# Patient Record
Sex: Male | Born: 1940 | Race: White | Hispanic: No | Smoking: Former smoker
Health system: Southern US, Community
[De-identification: ages and names within clinical notes are randomized; demographics above are authoritative.]

## PROBLEM LIST (undated history)

## (undated) DIAGNOSIS — I10 Essential (primary) hypertension: Secondary | ICD-10-CM

## (undated) DIAGNOSIS — E119 Type 2 diabetes mellitus without complications: Secondary | ICD-10-CM

---

## 2011-11-20 DIAGNOSIS — Z85828 Personal history of other malignant neoplasm of skin: Secondary | ICD-10-CM | POA: Diagnosis not present

## 2011-11-20 DIAGNOSIS — D485 Neoplasm of uncertain behavior of skin: Secondary | ICD-10-CM | POA: Diagnosis not present

## 2011-11-20 DIAGNOSIS — L570 Actinic keratosis: Secondary | ICD-10-CM | POA: Diagnosis not present

## 2011-11-20 DIAGNOSIS — D239 Other benign neoplasm of skin, unspecified: Secondary | ICD-10-CM | POA: Diagnosis not present

## 2012-03-11 DIAGNOSIS — Z125 Encounter for screening for malignant neoplasm of prostate: Secondary | ICD-10-CM | POA: Diagnosis not present

## 2012-03-11 DIAGNOSIS — Z79899 Other long term (current) drug therapy: Secondary | ICD-10-CM | POA: Diagnosis not present

## 2012-03-11 DIAGNOSIS — E119 Type 2 diabetes mellitus without complications: Secondary | ICD-10-CM | POA: Diagnosis not present

## 2012-03-13 DIAGNOSIS — E119 Type 2 diabetes mellitus without complications: Secondary | ICD-10-CM | POA: Diagnosis not present

## 2012-03-13 DIAGNOSIS — Z23 Encounter for immunization: Secondary | ICD-10-CM | POA: Diagnosis not present

## 2012-03-13 DIAGNOSIS — I10 Essential (primary) hypertension: Secondary | ICD-10-CM | POA: Diagnosis not present

## 2012-03-13 DIAGNOSIS — E7800 Pure hypercholesterolemia, unspecified: Secondary | ICD-10-CM | POA: Diagnosis not present

## 2012-06-04 DIAGNOSIS — E119 Type 2 diabetes mellitus without complications: Secondary | ICD-10-CM | POA: Diagnosis not present

## 2012-11-25 DIAGNOSIS — L723 Sebaceous cyst: Secondary | ICD-10-CM | POA: Diagnosis not present

## 2012-11-25 DIAGNOSIS — Z85828 Personal history of other malignant neoplasm of skin: Secondary | ICD-10-CM | POA: Diagnosis not present

## 2012-11-25 DIAGNOSIS — L821 Other seborrheic keratosis: Secondary | ICD-10-CM | POA: Diagnosis not present

## 2012-12-11 DIAGNOSIS — E119 Type 2 diabetes mellitus without complications: Secondary | ICD-10-CM | POA: Diagnosis not present

## 2012-12-11 DIAGNOSIS — E782 Mixed hyperlipidemia: Secondary | ICD-10-CM | POA: Diagnosis not present

## 2012-12-16 DIAGNOSIS — E119 Type 2 diabetes mellitus without complications: Secondary | ICD-10-CM | POA: Diagnosis not present

## 2012-12-16 DIAGNOSIS — E7800 Pure hypercholesterolemia, unspecified: Secondary | ICD-10-CM | POA: Diagnosis not present

## 2012-12-16 DIAGNOSIS — I10 Essential (primary) hypertension: Secondary | ICD-10-CM | POA: Diagnosis not present

## 2013-04-01 DIAGNOSIS — I10 Essential (primary) hypertension: Secondary | ICD-10-CM | POA: Diagnosis not present

## 2013-04-01 DIAGNOSIS — Z125 Encounter for screening for malignant neoplasm of prostate: Secondary | ICD-10-CM | POA: Diagnosis not present

## 2013-04-01 DIAGNOSIS — E119 Type 2 diabetes mellitus without complications: Secondary | ICD-10-CM | POA: Diagnosis not present

## 2013-04-02 DIAGNOSIS — Z006 Encounter for examination for normal comparison and control in clinical research program: Secondary | ICD-10-CM | POA: Diagnosis not present

## 2013-04-02 DIAGNOSIS — E119 Type 2 diabetes mellitus without complications: Secondary | ICD-10-CM | POA: Diagnosis not present

## 2013-06-22 DIAGNOSIS — E119 Type 2 diabetes mellitus without complications: Secondary | ICD-10-CM | POA: Diagnosis not present

## 2013-06-22 DIAGNOSIS — Z0000 Encounter for general adult medical examination without abnormal findings: Secondary | ICD-10-CM | POA: Diagnosis not present

## 2013-07-06 DIAGNOSIS — E119 Type 2 diabetes mellitus without complications: Secondary | ICD-10-CM | POA: Diagnosis not present

## 2013-11-25 DIAGNOSIS — D1801 Hemangioma of skin and subcutaneous tissue: Secondary | ICD-10-CM | POA: Diagnosis not present

## 2013-11-25 DIAGNOSIS — L821 Other seborrheic keratosis: Secondary | ICD-10-CM | POA: Diagnosis not present

## 2013-11-25 DIAGNOSIS — Z85820 Personal history of malignant melanoma of skin: Secondary | ICD-10-CM | POA: Diagnosis not present

## 2013-11-25 DIAGNOSIS — L570 Actinic keratosis: Secondary | ICD-10-CM | POA: Diagnosis not present

## 2014-01-25 DIAGNOSIS — E119 Type 2 diabetes mellitus without complications: Secondary | ICD-10-CM | POA: Diagnosis not present

## 2014-01-27 DIAGNOSIS — E119 Type 2 diabetes mellitus without complications: Secondary | ICD-10-CM | POA: Diagnosis not present

## 2014-08-19 DIAGNOSIS — E119 Type 2 diabetes mellitus without complications: Secondary | ICD-10-CM | POA: Diagnosis not present

## 2014-08-24 DIAGNOSIS — Z0000 Encounter for general adult medical examination without abnormal findings: Secondary | ICD-10-CM | POA: Diagnosis not present

## 2014-08-24 DIAGNOSIS — E119 Type 2 diabetes mellitus without complications: Secondary | ICD-10-CM | POA: Diagnosis not present

## 2014-11-15 DIAGNOSIS — D1801 Hemangioma of skin and subcutaneous tissue: Secondary | ICD-10-CM | POA: Diagnosis not present

## 2014-11-15 DIAGNOSIS — L858 Other specified epidermal thickening: Secondary | ICD-10-CM | POA: Diagnosis not present

## 2014-11-15 DIAGNOSIS — L918 Other hypertrophic disorders of the skin: Secondary | ICD-10-CM | POA: Diagnosis not present

## 2014-11-15 DIAGNOSIS — Z85820 Personal history of malignant melanoma of skin: Secondary | ICD-10-CM | POA: Diagnosis not present

## 2014-11-15 DIAGNOSIS — L821 Other seborrheic keratosis: Secondary | ICD-10-CM | POA: Diagnosis not present

## 2015-02-01 DIAGNOSIS — H40053 Ocular hypertension, bilateral: Secondary | ICD-10-CM | POA: Diagnosis not present

## 2015-02-01 DIAGNOSIS — H35363 Drusen (degenerative) of macula, bilateral: Secondary | ICD-10-CM | POA: Diagnosis not present

## 2015-02-01 DIAGNOSIS — H43393 Other vitreous opacities, bilateral: Secondary | ICD-10-CM | POA: Diagnosis not present

## 2015-02-01 DIAGNOSIS — H52223 Regular astigmatism, bilateral: Secondary | ICD-10-CM | POA: Diagnosis not present

## 2015-02-01 DIAGNOSIS — H2513 Age-related nuclear cataract, bilateral: Secondary | ICD-10-CM | POA: Diagnosis not present

## 2015-02-01 DIAGNOSIS — H5213 Myopia, bilateral: Secondary | ICD-10-CM | POA: Diagnosis not present

## 2015-02-01 DIAGNOSIS — E119 Type 2 diabetes mellitus without complications: Secondary | ICD-10-CM | POA: Diagnosis not present

## 2015-03-01 DIAGNOSIS — E119 Type 2 diabetes mellitus without complications: Secondary | ICD-10-CM | POA: Diagnosis not present

## 2015-03-06 DIAGNOSIS — Z1389 Encounter for screening for other disorder: Secondary | ICD-10-CM | POA: Diagnosis not present

## 2015-03-06 DIAGNOSIS — Z9181 History of falling: Secondary | ICD-10-CM | POA: Diagnosis not present

## 2015-03-06 DIAGNOSIS — E119 Type 2 diabetes mellitus without complications: Secondary | ICD-10-CM | POA: Diagnosis not present

## 2015-04-26 DIAGNOSIS — D123 Benign neoplasm of transverse colon: Secondary | ICD-10-CM | POA: Diagnosis not present

## 2015-04-26 DIAGNOSIS — Z09 Encounter for follow-up examination after completed treatment for conditions other than malignant neoplasm: Secondary | ICD-10-CM | POA: Diagnosis not present

## 2015-04-26 DIAGNOSIS — K5730 Diverticulosis of large intestine without perforation or abscess without bleeding: Secondary | ICD-10-CM | POA: Diagnosis not present

## 2015-04-26 DIAGNOSIS — D126 Benign neoplasm of colon, unspecified: Secondary | ICD-10-CM | POA: Diagnosis not present

## 2015-04-26 DIAGNOSIS — Z86010 Personal history of colonic polyps: Secondary | ICD-10-CM | POA: Diagnosis not present

## 2015-10-18 DIAGNOSIS — E119 Type 2 diabetes mellitus without complications: Secondary | ICD-10-CM | POA: Diagnosis not present

## 2015-10-24 DIAGNOSIS — E119 Type 2 diabetes mellitus without complications: Secondary | ICD-10-CM | POA: Diagnosis not present

## 2015-10-24 DIAGNOSIS — Z0000 Encounter for general adult medical examination without abnormal findings: Secondary | ICD-10-CM | POA: Diagnosis not present

## 2015-11-16 DIAGNOSIS — D1801 Hemangioma of skin and subcutaneous tissue: Secondary | ICD-10-CM | POA: Diagnosis not present

## 2015-11-16 DIAGNOSIS — L858 Other specified epidermal thickening: Secondary | ICD-10-CM | POA: Diagnosis not present

## 2015-11-16 DIAGNOSIS — L821 Other seborrheic keratosis: Secondary | ICD-10-CM | POA: Diagnosis not present

## 2015-11-16 DIAGNOSIS — Z85820 Personal history of malignant melanoma of skin: Secondary | ICD-10-CM | POA: Diagnosis not present

## 2015-11-16 DIAGNOSIS — D225 Melanocytic nevi of trunk: Secondary | ICD-10-CM | POA: Diagnosis not present

## 2016-01-08 DIAGNOSIS — E785 Hyperlipidemia, unspecified: Secondary | ICD-10-CM | POA: Diagnosis not present

## 2016-01-08 DIAGNOSIS — E119 Type 2 diabetes mellitus without complications: Secondary | ICD-10-CM | POA: Diagnosis not present

## 2016-01-12 DIAGNOSIS — E1129 Type 2 diabetes mellitus with other diabetic kidney complication: Secondary | ICD-10-CM | POA: Diagnosis not present

## 2016-01-12 DIAGNOSIS — Z23 Encounter for immunization: Secondary | ICD-10-CM | POA: Diagnosis not present

## 2016-01-12 DIAGNOSIS — Z2821 Immunization not carried out because of patient refusal: Secondary | ICD-10-CM | POA: Diagnosis not present

## 2016-06-18 ENCOUNTER — Other Ambulatory Visit: Payer: Self-pay

## 2016-07-31 DIAGNOSIS — E785 Hyperlipidemia, unspecified: Secondary | ICD-10-CM | POA: Diagnosis not present

## 2016-07-31 DIAGNOSIS — Z125 Encounter for screening for malignant neoplasm of prostate: Secondary | ICD-10-CM | POA: Diagnosis not present

## 2016-07-31 DIAGNOSIS — Z79899 Other long term (current) drug therapy: Secondary | ICD-10-CM | POA: Diagnosis not present

## 2016-07-31 DIAGNOSIS — E1129 Type 2 diabetes mellitus with other diabetic kidney complication: Secondary | ICD-10-CM | POA: Diagnosis not present

## 2016-08-06 DIAGNOSIS — Z2821 Immunization not carried out because of patient refusal: Secondary | ICD-10-CM | POA: Diagnosis not present

## 2016-08-06 DIAGNOSIS — Z9181 History of falling: Secondary | ICD-10-CM | POA: Diagnosis not present

## 2016-08-06 DIAGNOSIS — E1129 Type 2 diabetes mellitus with other diabetic kidney complication: Secondary | ICD-10-CM | POA: Diagnosis not present

## 2016-11-21 DIAGNOSIS — D1801 Hemangioma of skin and subcutaneous tissue: Secondary | ICD-10-CM | POA: Diagnosis not present

## 2016-11-21 DIAGNOSIS — D225 Melanocytic nevi of trunk: Secondary | ICD-10-CM | POA: Diagnosis not present

## 2016-11-21 DIAGNOSIS — L821 Other seborrheic keratosis: Secondary | ICD-10-CM | POA: Diagnosis not present

## 2016-11-21 DIAGNOSIS — Z85820 Personal history of malignant melanoma of skin: Secondary | ICD-10-CM | POA: Diagnosis not present

## 2016-11-21 DIAGNOSIS — L570 Actinic keratosis: Secondary | ICD-10-CM | POA: Diagnosis not present

## 2016-12-05 DIAGNOSIS — H5213 Myopia, bilateral: Secondary | ICD-10-CM | POA: Diagnosis not present

## 2016-12-05 DIAGNOSIS — H52223 Regular astigmatism, bilateral: Secondary | ICD-10-CM | POA: Diagnosis not present

## 2016-12-05 DIAGNOSIS — E119 Type 2 diabetes mellitus without complications: Secondary | ICD-10-CM | POA: Diagnosis not present

## 2016-12-05 DIAGNOSIS — H40013 Open angle with borderline findings, low risk, bilateral: Secondary | ICD-10-CM | POA: Diagnosis not present

## 2016-12-05 DIAGNOSIS — Z7984 Long term (current) use of oral hypoglycemic drugs: Secondary | ICD-10-CM | POA: Diagnosis not present

## 2016-12-05 DIAGNOSIS — H40053 Ocular hypertension, bilateral: Secondary | ICD-10-CM | POA: Diagnosis not present

## 2017-01-07 DIAGNOSIS — H40059 Ocular hypertension, unspecified eye: Secondary | ICD-10-CM | POA: Diagnosis not present

## 2017-01-07 DIAGNOSIS — H53422 Scotoma of blind spot area, left eye: Secondary | ICD-10-CM | POA: Diagnosis not present

## 2017-01-20 ENCOUNTER — Emergency Department (HOSPITAL_BASED_OUTPATIENT_CLINIC_OR_DEPARTMENT_OTHER): Payer: Medicare Other

## 2017-01-20 ENCOUNTER — Encounter (HOSPITAL_BASED_OUTPATIENT_CLINIC_OR_DEPARTMENT_OTHER): Payer: Self-pay | Admitting: Emergency Medicine

## 2017-01-20 ENCOUNTER — Emergency Department (HOSPITAL_BASED_OUTPATIENT_CLINIC_OR_DEPARTMENT_OTHER)
Admission: EM | Admit: 2017-01-20 | Discharge: 2017-01-20 | Disposition: A | Discharge status: Home / Self Care | Payer: Medicare Other | Attending: Emergency Medicine | Admitting: Emergency Medicine

## 2017-01-20 DIAGNOSIS — Z7984 Long term (current) use of oral hypoglycemic drugs: Secondary | ICD-10-CM | POA: Diagnosis not present

## 2017-01-20 DIAGNOSIS — M25562 Pain in left knee: Secondary | ICD-10-CM

## 2017-01-20 DIAGNOSIS — S86912A Strain of unspecified muscle(s) and tendon(s) at lower leg level, left leg, initial encounter: Secondary | ICD-10-CM | POA: Diagnosis not present

## 2017-01-20 DIAGNOSIS — Y92002 Bathroom of unspecified non-institutional (private) residence single-family (private) house as the place of occurrence of the external cause: Secondary | ICD-10-CM | POA: Insufficient documentation

## 2017-01-20 DIAGNOSIS — Z79899 Other long term (current) drug therapy: Secondary | ICD-10-CM | POA: Insufficient documentation

## 2017-01-20 DIAGNOSIS — M7989 Other specified soft tissue disorders: Secondary | ICD-10-CM | POA: Diagnosis not present

## 2017-01-20 DIAGNOSIS — Z87891 Personal history of nicotine dependence: Secondary | ICD-10-CM | POA: Insufficient documentation

## 2017-01-20 DIAGNOSIS — S8992XA Unspecified injury of left lower leg, initial encounter: Secondary | ICD-10-CM | POA: Diagnosis present

## 2017-01-20 DIAGNOSIS — Y999 Unspecified external cause status: Secondary | ICD-10-CM | POA: Insufficient documentation

## 2017-01-20 DIAGNOSIS — Y939 Activity, unspecified: Secondary | ICD-10-CM | POA: Diagnosis not present

## 2017-01-20 DIAGNOSIS — W1840XA Slipping, tripping and stumbling without falling, unspecified, initial encounter: Secondary | ICD-10-CM | POA: Insufficient documentation

## 2017-01-20 DIAGNOSIS — E119 Type 2 diabetes mellitus without complications: Secondary | ICD-10-CM | POA: Insufficient documentation

## 2017-01-20 DIAGNOSIS — S86812A Strain of other muscle(s) and tendon(s) at lower leg level, left leg, initial encounter: Secondary | ICD-10-CM

## 2017-01-20 DIAGNOSIS — I10 Essential (primary) hypertension: Secondary | ICD-10-CM | POA: Insufficient documentation

## 2017-01-20 HISTORY — DX: Essential (primary) hypertension: I10

## 2017-01-20 HISTORY — DX: Type 2 diabetes mellitus without complications: E11.9

## 2017-01-20 NOTE — ED Provider Notes (Signed)
[facility] Provider Note   CSN: [id number] Arrival date & time: 01/20/17  0820     History   Chief Complaint Chief Complaint  Patient presents with  . Knee Pain    HPI Frank Taylor is a 76 y.o. male.  HPI   Stepped out of shower Friday night, had right leg on floor and left leg slipped and had terrible pain in back of left knee. Tried ice, heat, aleve. Getting slowly better since the incident. Left leg slipped but no fall.  No numbness weakness. Pain is present in the back of his left knee, his upper calf since this incident. Denies numbness, weakness. Reports the pain was 10 out of 10 on Friday night, however at this time is 0 out of 10 with rest, 5 out of 10 when he is walking on it. He is using his friend's cane for assistance.  Reports this is the first XR he has had in his life.  Past Medical History:  Diagnosis Date  . Diabetes mellitus without complication ([place])   . Hypertension     There are no active problems to display for this patient.   History reviewed. No pertinent surgical history.     Home Medications    Prior to Admission medications   Medication Sig Start Date End Date Taking? Authorizing Provider  metFORMIN (GLUCOPHAGE-XR) 750 MG 24 hr tablet Take 750 mg by mouth daily with breakfast.   Yes Historical Provider, MD  pravastatin (PRAVACHOL) 80 MG tablet Take 80 mg by mouth daily.   Yes Historical Provider, MD  valsartan (DIOVAN) 160 MG tablet Take 160 mg by mouth daily.   Yes Historical Provider, MD    Family History No family history on file.  Social History Social History  Substance Use Topics  . Smoking status: Former Research scientist (life sciences)  . Smokeless tobacco: Never Used  . Alcohol use Yes     Allergies   Sulfa antibiotics   Review of Systems Review of Systems  Constitutional: Negative for fever.  HENT: Negative for sore throat.   Eyes: Negative for visual disturbance.  Respiratory: Negative for shortness of breath.     Cardiovascular: Negative for chest pain.  Gastrointestinal: Negative for abdominal pain.  Genitourinary: Negative for difficulty urinating.  Musculoskeletal: Positive for arthralgias. Negative for back pain and neck stiffness.  Skin: Negative for rash.  Neurological: Negative for syncope and headaches.     Physical Exam Updated Vital Signs BP (!) 153/95   Pulse (!) 104   Temp 98.1 F (36.7 C) (Oral)   Resp 16   SpO2 96%   Physical Exam  Constitutional: He is oriented to person, place, and time. He appears well-developed and well-nourished. No distress.  HENT:  Head: Normocephalic and atraumatic.  Eyes: Conjunctivae and EOM are normal.  Neck: Normal range of motion.  Cardiovascular: Normal rate, regular rhythm, normal heart sounds and intact distal pulses.  Exam reveals no gallop and no friction rub.   No murmur heard. Pulmonary/Chest: Effort normal and breath sounds normal. No respiratory distress. He has no wheezes. He has no rales.  Abdominal: Soft. He exhibits no distension. There is no tenderness. There is no guarding.  Musculoskeletal: He exhibits no edema.       Right knee: He exhibits normal range of motion, no swelling and no effusion. No tenderness found.       Left knee: He exhibits normal range of motion, no swelling, no effusion and no bony tenderness. No tenderness found.  Neurological:  He is alert and oriented to person, place, and time.  Skin: Skin is warm and dry. He is not diaphoretic.  Nursing note and vitals reviewed.    ED Treatments / Results  Labs (all labs ordered are listed, but only abnormal results are displayed) Labs Reviewed - No data to display  EKG  EKG Interpretation None       Radiology Dg Knee Complete 4 Views Left  Result Date: 01/20/2017 CLINICAL DATA:  Pain after slipping in shower EXAM: LEFT KNEE - COMPLETE 4+ VIEW COMPARISON:  None. FINDINGS: Frontal, lateral, and bilateral oblique views were obtained. There is generalized  soft tissue swelling. No fracture or dislocation. No joint effusion. The joint spaces appear normal. There is mild intracondylar spurring. No erosion. IMPRESSION: Soft tissue swelling. No fracture or joint effusion. Mild intracondylar spurring. No appreciable joint space narrowing or erosion. Electronically Signed   By: Lowella Grip III M.D.   On: 01/20/2017 08:53    Procedures Procedures (including critical care time)  Medications Ordered in ED Medications - No data to display   Initial Impression / Assessment and Plan / ED Course  I have reviewed the triage vital signs and the nursing notes.  Pertinent labs & imaging results that were available during my care of the patient were reviewed by me and considered in my medical decision making (see chart for details).     76 year old male with a history of hypertension and diabetes presents with left behind the knee pain that began after slipping in the shower and Friday night. X-ray shows some mild soft tissue swelling, no fractures, no significant abnormalities.  Given acute traumatic cause, low suspicion for DVT. No asymmetric leg swelling, normal pulses bilaterally, low suspicion suspicion for both arterial and venous thrombus. No sign of infection or erythema. Suspect patient likely has muscular strain of his gastric anemia's, which has been slowly resolving since Friday. Of note, patient reports he is not typically go to the doctor, has never been in the emergency department, notes that he typically has elevated blood pressures and heart rate due to anxiety in health care settings. His heart rate is in a sinus rhythm on the monitor. Suspect his elevated heart rate is likely secondary to anxiety.  Patient stable for discharge home.  Recommend continuing ice, heat, ibuprofen and following up with the regular doctor.  Final Clinical Impressions(s) / ED Diagnoses   Final diagnoses:  Acute pain of left knee  Strain of calf muscle, left,  initial encounter    New Prescriptions Discharge Medication List as of 01/20/2017  9:24 AM       Gareth Morgan, MD 01/20/17 [phone]

## 2017-01-20 NOTE — ED Triage Notes (Signed)
Slipped getting out of the shower on Friday and twisted left knee, pain with wt bearing, none at rest. No LOC

## 2017-05-21 DIAGNOSIS — E1129 Type 2 diabetes mellitus with other diabetic kidney complication: Secondary | ICD-10-CM | POA: Diagnosis not present

## 2017-05-27 DIAGNOSIS — Z0000 Encounter for general adult medical examination without abnormal findings: Secondary | ICD-10-CM | POA: Diagnosis not present

## 2017-05-27 DIAGNOSIS — Z6835 Body mass index (BMI) 35.0-35.9, adult: Secondary | ICD-10-CM | POA: Diagnosis not present

## 2017-05-27 DIAGNOSIS — Z1389 Encounter for screening for other disorder: Secondary | ICD-10-CM | POA: Diagnosis not present

## 2017-05-27 DIAGNOSIS — E1129 Type 2 diabetes mellitus with other diabetic kidney complication: Secondary | ICD-10-CM | POA: Diagnosis not present

## 2017-06-02 DIAGNOSIS — L821 Other seborrheic keratosis: Secondary | ICD-10-CM | POA: Diagnosis not present

## 2017-06-02 DIAGNOSIS — L570 Actinic keratosis: Secondary | ICD-10-CM | POA: Diagnosis not present

## 2017-06-30 DIAGNOSIS — H40053 Ocular hypertension, bilateral: Secondary | ICD-10-CM | POA: Diagnosis not present

## 2017-08-06 ENCOUNTER — Encounter (HOSPITAL_BASED_OUTPATIENT_CLINIC_OR_DEPARTMENT_OTHER): Payer: Self-pay

## 2017-08-06 ENCOUNTER — Emergency Department (HOSPITAL_BASED_OUTPATIENT_CLINIC_OR_DEPARTMENT_OTHER)
Admission: EM | Admit: 2017-08-06 | Discharge: 2017-08-06 | Disposition: A | Discharge status: Home / Self Care | Payer: Medicare Other | Attending: Emergency Medicine | Admitting: Emergency Medicine

## 2017-08-06 DIAGNOSIS — E119 Type 2 diabetes mellitus without complications: Secondary | ICD-10-CM | POA: Diagnosis not present

## 2017-08-06 DIAGNOSIS — Z7984 Long term (current) use of oral hypoglycemic drugs: Secondary | ICD-10-CM | POA: Insufficient documentation

## 2017-08-06 DIAGNOSIS — Z7902 Long term (current) use of antithrombotics/antiplatelets: Secondary | ICD-10-CM | POA: Insufficient documentation

## 2017-08-06 DIAGNOSIS — R21 Rash and other nonspecific skin eruption: Secondary | ICD-10-CM

## 2017-08-06 DIAGNOSIS — Z87891 Personal history of nicotine dependence: Secondary | ICD-10-CM | POA: Insufficient documentation

## 2017-08-06 DIAGNOSIS — I10 Essential (primary) hypertension: Secondary | ICD-10-CM | POA: Diagnosis not present

## 2017-08-06 DIAGNOSIS — B029 Zoster without complications: Secondary | ICD-10-CM | POA: Diagnosis not present

## 2017-08-06 DIAGNOSIS — B028 Zoster with other complications: Secondary | ICD-10-CM | POA: Diagnosis not present

## 2017-08-06 MED ORDER — VALACYCLOVIR HCL 500 MG PO TABS
1000.0000 mg | ORAL_TABLET | Freq: Once | ORAL | Status: AC
  Administered 2017-08-06: 1000 mg via ORAL
  Filled 2017-08-06: qty 2

## 2017-08-06 MED ORDER — VALACYCLOVIR HCL 1 G PO TABS: 1000.0000 mg | ORAL_TABLET | Freq: Three times a day (TID) | ORAL | 0 refills | Status: AC

## 2017-08-06 NOTE — ED Notes (Signed)
ED Provider at bedside. 

## 2017-08-06 NOTE — ED Triage Notes (Signed)
C/o rash to left abd area x today-NAD-steady gait

## 2017-08-06 NOTE — ED Provider Notes (Signed)
[facility] EMERGENCY DEPARTMENT Provider Note   CSN: [id number] Arrival date & time: 08/06/17  2042     History   Chief Complaint Chief Complaint  Patient presents with  . Rash    HPI Frank Taylor is a 76 y.o. male.  The history is provided by the patient, the spouse and medical records. No language interpreter was used.  Rash   This is a new problem. The current episode started 12 to 24 hours ago. The problem has been gradually worsening. The problem is associated with nothing. There has been no fever. The pain is at a severity of 0/10. The patient is experiencing no pain. The pain has been constant since onset. Associated symptoms include blisters. He has tried nothing for the symptoms. The treatment provided no relief.    Past Medical History:  Diagnosis Date  . Diabetes mellitus without complication ([place])   . Hypertension     There are no active problems to display for this patient.   History reviewed. No pertinent surgical history.     Home Medications    Prior to Admission medications   Medication Sig Start Date End Date Taking? Authorizing Provider  metFORMIN (GLUCOPHAGE-XR) 750 MG 24 hr tablet Take 750 mg by mouth daily with breakfast.    [provider]  pravastatin (PRAVACHOL) 80 MG tablet Take 80 mg by mouth daily.    [provider]  valsartan (DIOVAN) 160 MG tablet Take 160 mg by mouth daily.    [provider]    Family History No family history on file.  Social History Social History  Substance Use Topics  . Smoking status: Former Research scientist (life sciences)  . Smokeless tobacco: Never Used  . Alcohol use Yes     Allergies   Sulfa antibiotics   Review of Systems Review of Systems  Constitutional: Negative for chills, diaphoresis, fatigue and fever.  HENT: Negative for congestion.   Eyes: Negative for photophobia and visual disturbance.  Respiratory: Negative for chest tightness, shortness of breath, wheezing and  stridor.   Cardiovascular: Negative for chest pain.  Gastrointestinal: Negative for abdominal pain, constipation, diarrhea, nausea and vomiting.  Genitourinary: Negative for flank pain and frequency.  Musculoskeletal: Negative for back pain, neck pain and neck stiffness.  Skin: Positive for rash. Negative for wound.  Neurological: Negative for headaches.  Psychiatric/Behavioral: Negative for agitation.  All other systems reviewed and are negative.    Physical Exam Updated Vital Signs BP (!) 159/104 (BP Location: Left Arm)   Pulse 98   Temp 98.4 F (36.9 C) (Oral)   Resp 18   Ht 5\' 10"  (1.778 m)   Wt 100.7 kg (222 lb)   SpO2 98%   BMI 31.85 kg/m   Physical Exam  Constitutional: He is oriented to person, place, and time. He appears well-developed and well-nourished. No distress.  HENT:  Head: Normocephalic and atraumatic.  Mouth/Throat: Oropharynx is clear and moist. No oropharyngeal exudate.  Eyes: Conjunctivae are normal.  Neck: Neck supple.  Cardiovascular: Normal rate and regular rhythm.   No murmur heard. Pulmonary/Chest: Effort normal and breath sounds normal. No respiratory distress.  Abdominal: Soft. There is no tenderness.  Musculoskeletal: He exhibits no edema or tenderness.  Neurological: He is alert and oriented to person, place, and time. No sensory deficit. He exhibits normal muscle tone.  Skin: Skin is warm and dry. Capillary refill takes less than 2 seconds. Rash noted. Rash is vesicular. He is not diaphoretic.     Vision has  a vesicular rash in a dermatomal pattern in the right back radiating around to the right chest.  No other locations of rash on the body.  Appears like shingles.  No Rash on the face or nose.  Psychiatric: He has a normal mood and affect.  Nursing note and vitals reviewed.    ED Treatments / Results  Labs (all labs ordered are listed, but only abnormal results are displayed) Labs Reviewed - No data to display  EKG  EKG  Interpretation None       Radiology No results found.  Procedures Procedures (including critical care time)  Medications Ordered in ED Medications  valACYclovir (VALTREX) tablet 1,000 mg (1,000 mg Oral Given 08/06/17 2211)     Initial Impression / Assessment and Plan / ED Course  I have reviewed the triage vital signs and the nursing notes.  Pertinent labs & imaging results that were available during my care of the patient were reviewed by me and considered in my medical decision making (see chart for details).     Frank Taylor is a 76 y.o. male with a past medical history significant for hypertension and diabetes who presents with rash.  Patient says that he felt tingling in his left side of his torso yesterday and then.  Rash goes from his left posterior back around his chest towards the middle.  There is lots of areas of small red bumps and it is "tingling".  He denies severe pain.  He denies fevers, chills, nausea, vomiting, conservation, diarrhea, dysuria.  He denies traumas.  He is new detergents or soaps.  No new clothes.  No history of similar symptoms and no recent exposures to other patients.  On exam, patient has a vesicular rash that appears to look like shingles.  It goes from the left upper back in a dermatomal pattern around to the chest.  Lungs are otherwise clear chest is nontender and he has no focal neurologic deficits.  Specifically, no rash on the face or nose.  No reported vision changes.  Based on evaluation and symptoms, patient appears to have a shingles outbreak.  Rash does not look or a appear cellulitic.  As his symptoms started yesterday and rash began today, patient will be treated with antiviral medication.  Patient will follow-up with PCP and understood return precautions for any new or worsening symptoms.  Patient has no other questions or concerns and was discharged in good condition.       Final Clinical Impressions(s) / ED Diagnoses   Final  diagnoses:  Rash  Herpes zoster without complication    New Prescriptions Discharge Medication List as of 08/06/2017 10:07 PM    START taking these medications   Details  valACYclovir (VALTREX) 1000 MG tablet Take 1 tablet (1,000 mg total) by mouth 3 (three) times daily., Starting Wed 08/06/2017, Until Wed 08/13/2017, Print        Clinical Impression: 1. Rash   2. Herpes zoster without complication     Disposition: Discharge  Condition: Good  I have discussed the results, Dx and Tx plan with the pt(& family if present). He/she/they expressed understanding and agree(s) with the plan. Discharge instructions discussed at great length. Strict return precautions discussed and pt &/or family have verbalized understanding of the instructions. No further questions at time of discharge.    Discharge Medication List as of 08/06/2017 10:07 PM    START taking these medications   Details  valACYclovir (VALTREX) 1000 MG tablet Take 1 tablet (1,000 mg  total) by mouth 3 (three) times daily., Starting Wed 08/06/2017, Until Wed 08/13/2017, Print        Follow Up: Angelina Sheriff, MD [place] [address] [phone]  Schedule an appointment as soon as possible for a visit    [facility] [address] [id number] [facility] [address] [place] [phone]  If symptoms worsen     Necha Harries, Gwenyth Allegra, MD 08/07/17 [phone]

## 2017-08-06 NOTE — Discharge Instructions (Signed)
Your rash today is consistent with a shingles outbreak.  As her symptoms began in the last 48 hours, we feel you are appropriate for antiviral medication.  Please take the medication 3 times a day for the next week.  Please follow-up with your primary care physician for reassessment.  If any symptoms change or worsen, please return to the nearest emergency department.  Hope you catch a big fish!

## 2017-11-20 DIAGNOSIS — H25813 Combined forms of age-related cataract, bilateral: Secondary | ICD-10-CM | POA: Diagnosis not present

## 2017-11-20 DIAGNOSIS — H5213 Myopia, bilateral: Secondary | ICD-10-CM | POA: Diagnosis not present

## 2017-11-20 DIAGNOSIS — E119 Type 2 diabetes mellitus without complications: Secondary | ICD-10-CM | POA: Diagnosis not present

## 2017-11-20 DIAGNOSIS — H21233 Degeneration of iris (pigmentary), bilateral: Secondary | ICD-10-CM | POA: Diagnosis not present

## 2017-11-20 DIAGNOSIS — Z7984 Long term (current) use of oral hypoglycemic drugs: Secondary | ICD-10-CM | POA: Diagnosis not present

## 2017-11-20 DIAGNOSIS — H52223 Regular astigmatism, bilateral: Secondary | ICD-10-CM | POA: Diagnosis not present

## 2017-11-25 DIAGNOSIS — Z85820 Personal history of malignant melanoma of skin: Secondary | ICD-10-CM | POA: Diagnosis not present

## 2017-11-25 DIAGNOSIS — D225 Melanocytic nevi of trunk: Secondary | ICD-10-CM | POA: Diagnosis not present

## 2017-11-25 DIAGNOSIS — D1801 Hemangioma of skin and subcutaneous tissue: Secondary | ICD-10-CM | POA: Diagnosis not present

## 2017-11-25 DIAGNOSIS — D485 Neoplasm of uncertain behavior of skin: Secondary | ICD-10-CM | POA: Diagnosis not present

## 2017-11-25 DIAGNOSIS — L821 Other seborrheic keratosis: Secondary | ICD-10-CM | POA: Diagnosis not present

## 2017-12-09 DIAGNOSIS — E1129 Type 2 diabetes mellitus with other diabetic kidney complication: Secondary | ICD-10-CM | POA: Diagnosis not present

## 2017-12-16 DIAGNOSIS — Z9181 History of falling: Secondary | ICD-10-CM | POA: Diagnosis not present

## 2017-12-16 DIAGNOSIS — Z1331 Encounter for screening for depression: Secondary | ICD-10-CM | POA: Diagnosis not present

## 2017-12-16 DIAGNOSIS — E1129 Type 2 diabetes mellitus with other diabetic kidney complication: Secondary | ICD-10-CM | POA: Diagnosis not present

## 2017-12-16 DIAGNOSIS — Z1339 Encounter for screening examination for other mental health and behavioral disorders: Secondary | ICD-10-CM | POA: Diagnosis not present

## 2017-12-16 IMAGING — CR DG KNEE COMPLETE 4+V*L*
4 series · 4 of 4 positions shown · non-contrast
Comparison: None.

CLINICAL DATA: Pain after slipping in shower

EXAM:
LEFT KNEE - COMPLETE 4+ VIEW

[t knee ap left]
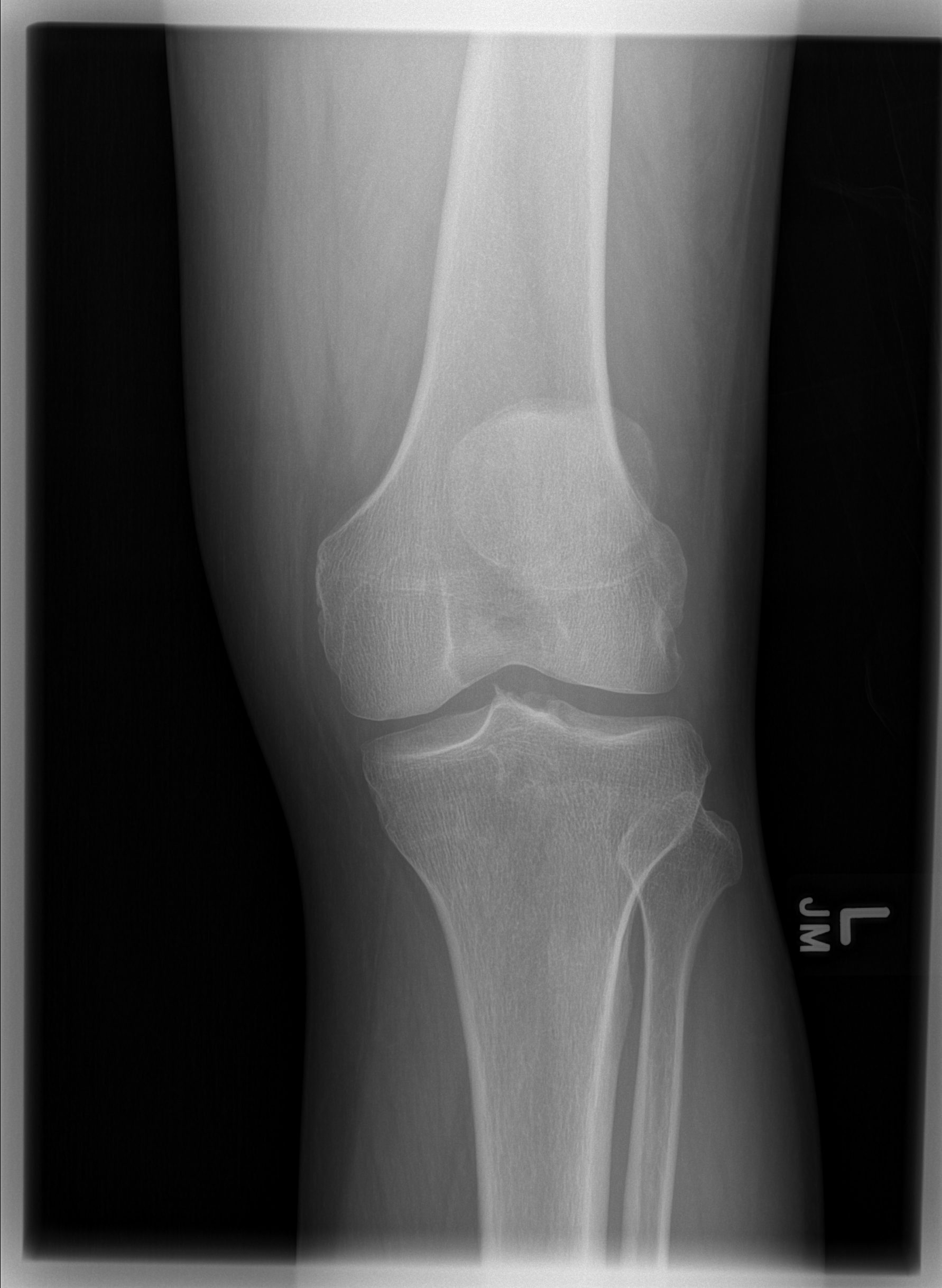

[t knee oblique left (1 of 2)]
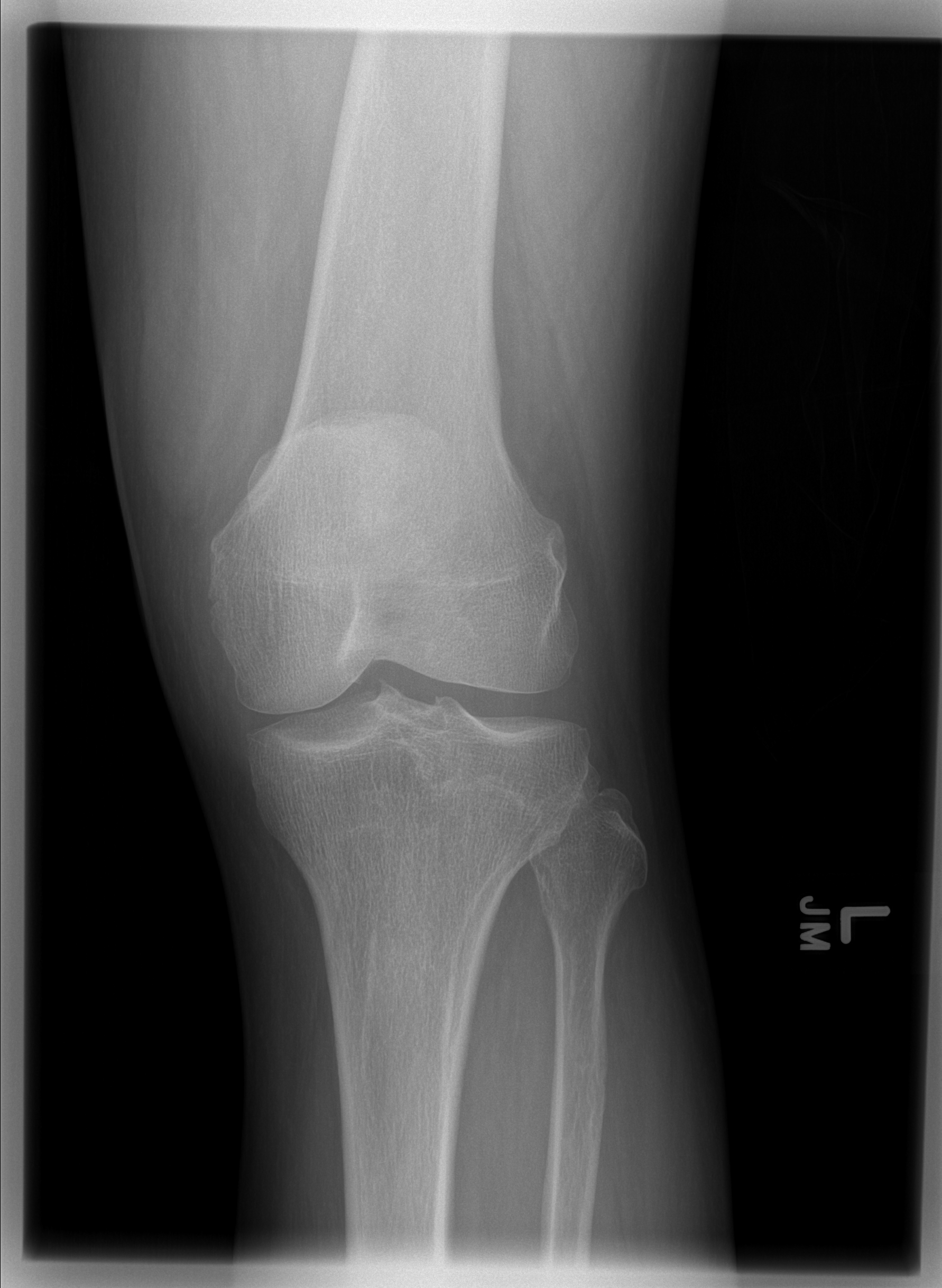

[t knee oblique left (2 of 2)]
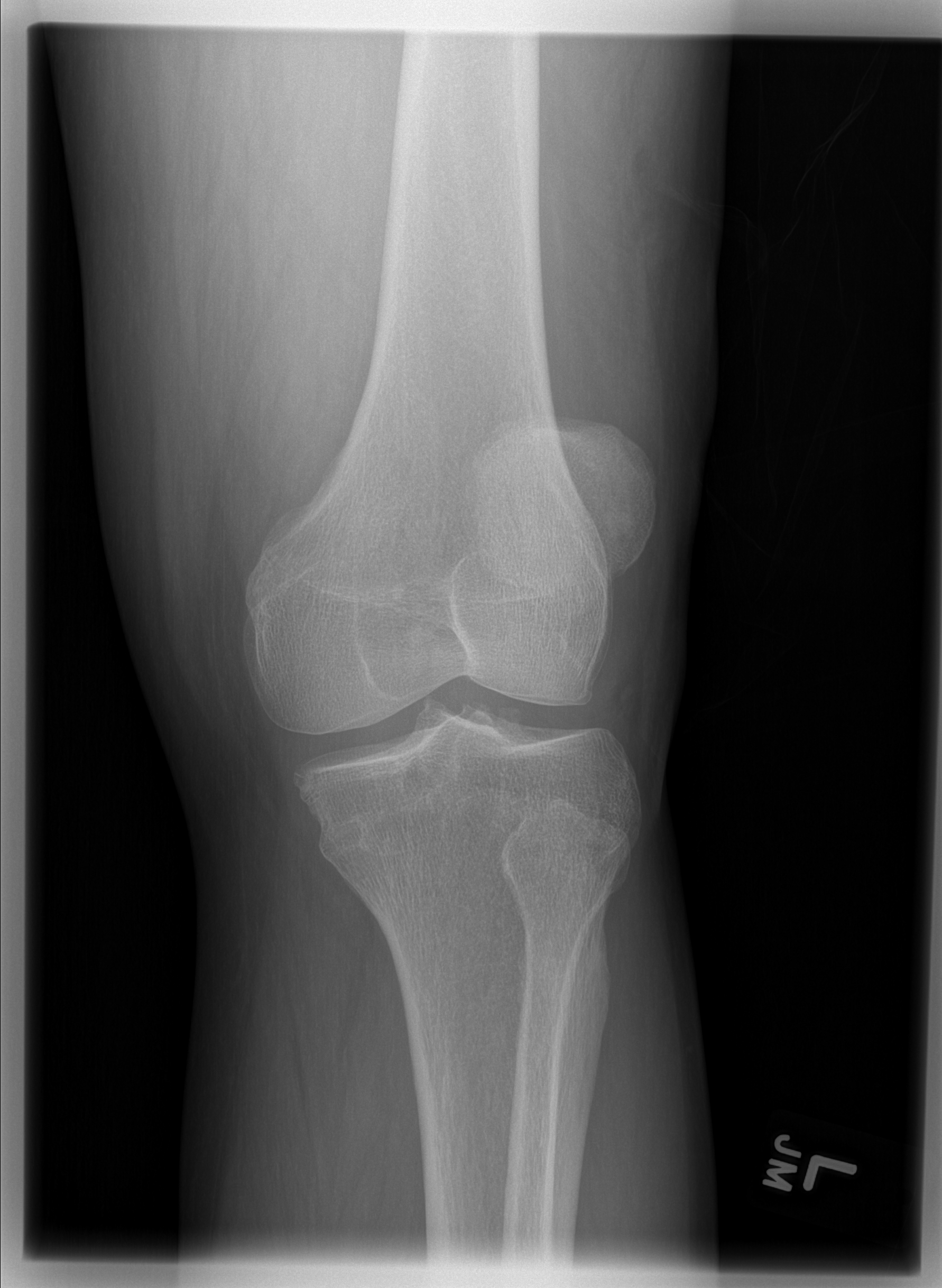

[t knee lat left]
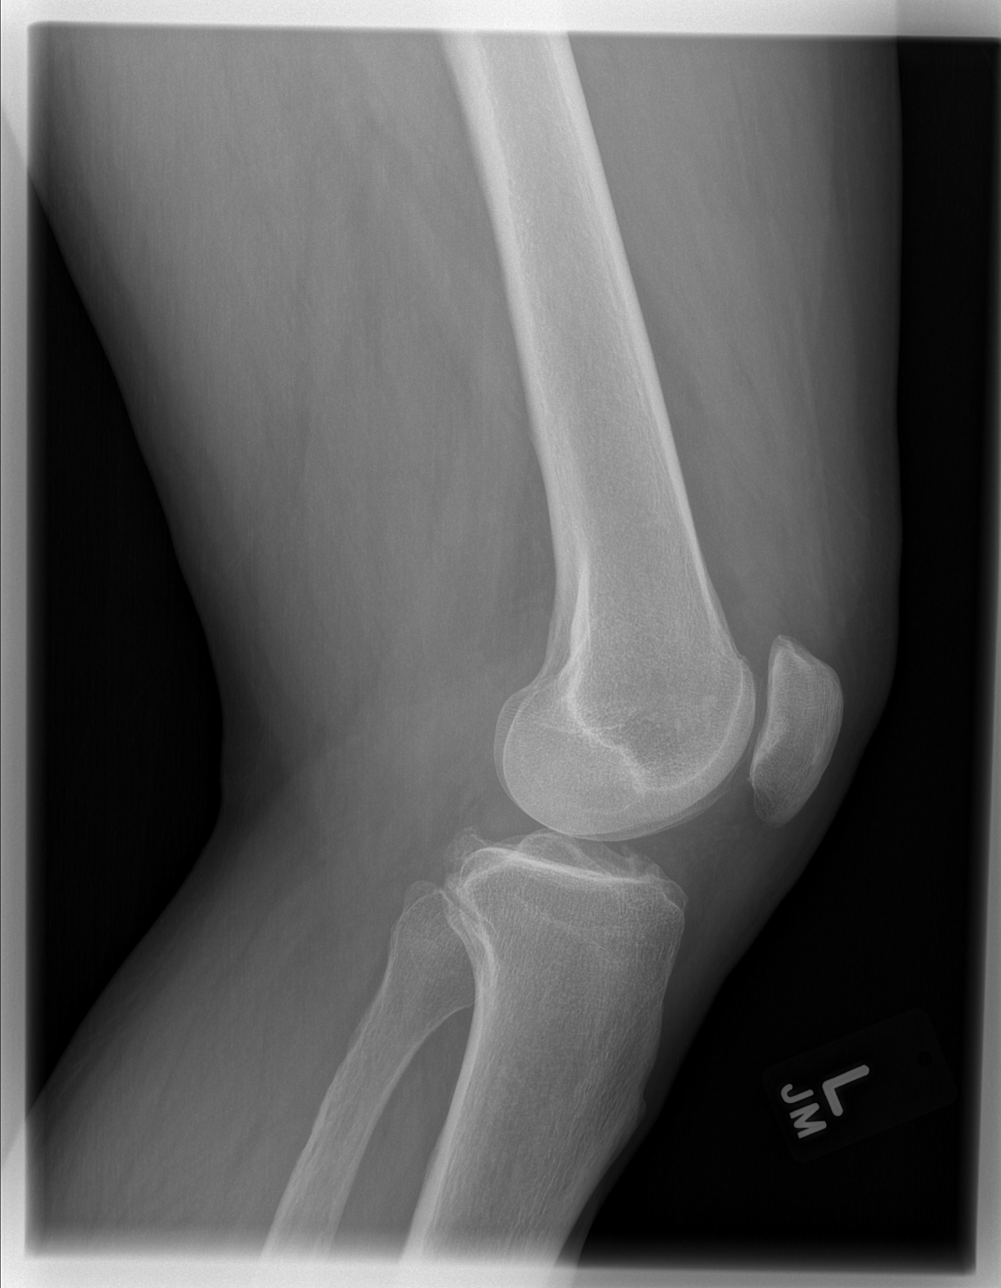

[4 of 4 positions shown; findings below may reference images not displayed]

FINDINGS: Frontal, lateral, and bilateral oblique views were obtained. There
is generalized soft tissue swelling. No fracture or dislocation. No
joint effusion. The joint spaces appear normal. There is mild
intracondylar spurring. No erosion.
IMPRESSION: Soft tissue swelling. No fracture or joint effusion. Mild
intracondylar spurring. No appreciable joint space narrowing or
erosion.

## 2018-11-16 DIAGNOSIS — E1129 Type 2 diabetes mellitus with other diabetic kidney complication: Secondary | ICD-10-CM | POA: Diagnosis not present

## 2018-11-23 DIAGNOSIS — E7800 Pure hypercholesterolemia, unspecified: Secondary | ICD-10-CM | POA: Diagnosis not present

## 2018-11-23 DIAGNOSIS — Z0000 Encounter for general adult medical examination without abnormal findings: Secondary | ICD-10-CM | POA: Diagnosis not present

## 2018-11-23 DIAGNOSIS — E1129 Type 2 diabetes mellitus with other diabetic kidney complication: Secondary | ICD-10-CM | POA: Diagnosis not present

## 2018-11-23 DIAGNOSIS — Z6835 Body mass index (BMI) 35.0-35.9, adult: Secondary | ICD-10-CM | POA: Diagnosis not present

## 2018-11-25 DIAGNOSIS — L723 Sebaceous cyst: Secondary | ICD-10-CM | POA: Diagnosis not present

## 2018-11-25 DIAGNOSIS — D485 Neoplasm of uncertain behavior of skin: Secondary | ICD-10-CM | POA: Diagnosis not present

## 2018-11-25 DIAGNOSIS — L821 Other seborrheic keratosis: Secondary | ICD-10-CM | POA: Diagnosis not present

## 2018-11-25 DIAGNOSIS — Z85820 Personal history of malignant melanoma of skin: Secondary | ICD-10-CM | POA: Diagnosis not present

## 2018-11-25 DIAGNOSIS — L738 Other specified follicular disorders: Secondary | ICD-10-CM | POA: Diagnosis not present

## 2018-11-25 DIAGNOSIS — D1801 Hemangioma of skin and subcutaneous tissue: Secondary | ICD-10-CM | POA: Diagnosis not present

## 2018-11-25 DIAGNOSIS — D225 Melanocytic nevi of trunk: Secondary | ICD-10-CM | POA: Diagnosis not present

## 2019-05-14 ENCOUNTER — Other Ambulatory Visit: Payer: Self-pay

## 2019-05-27 DIAGNOSIS — Z9181 History of falling: Secondary | ICD-10-CM | POA: Diagnosis not present

## 2019-05-27 DIAGNOSIS — E1165 Type 2 diabetes mellitus with hyperglycemia: Secondary | ICD-10-CM | POA: Diagnosis not present

## 2019-05-27 DIAGNOSIS — Z1331 Encounter for screening for depression: Secondary | ICD-10-CM | POA: Diagnosis not present

## 2019-05-27 DIAGNOSIS — Z6834 Body mass index (BMI) 34.0-34.9, adult: Secondary | ICD-10-CM | POA: Diagnosis not present

## 2019-08-19 DIAGNOSIS — Z9181 History of falling: Secondary | ICD-10-CM | POA: Diagnosis not present

## 2019-08-19 DIAGNOSIS — Z6833 Body mass index (BMI) 33.0-33.9, adult: Secondary | ICD-10-CM | POA: Diagnosis not present

## 2019-08-19 DIAGNOSIS — Z1331 Encounter for screening for depression: Secondary | ICD-10-CM | POA: Diagnosis not present

## 2019-08-19 DIAGNOSIS — E785 Hyperlipidemia, unspecified: Secondary | ICD-10-CM | POA: Diagnosis not present

## 2019-08-19 DIAGNOSIS — Z79899 Other long term (current) drug therapy: Secondary | ICD-10-CM | POA: Diagnosis not present

## 2019-08-19 DIAGNOSIS — E1129 Type 2 diabetes mellitus with other diabetic kidney complication: Secondary | ICD-10-CM | POA: Diagnosis not present

## 2019-08-19 DIAGNOSIS — I10 Essential (primary) hypertension: Secondary | ICD-10-CM | POA: Diagnosis not present

## 2019-11-29 DIAGNOSIS — L821 Other seborrheic keratosis: Secondary | ICD-10-CM | POA: Diagnosis not present

## 2019-11-29 DIAGNOSIS — D2261 Melanocytic nevi of right upper limb, including shoulder: Secondary | ICD-10-CM | POA: Diagnosis not present

## 2019-11-29 DIAGNOSIS — L723 Sebaceous cyst: Secondary | ICD-10-CM | POA: Diagnosis not present

## 2020-02-25 DIAGNOSIS — E1129 Type 2 diabetes mellitus with other diabetic kidney complication: Secondary | ICD-10-CM | POA: Diagnosis not present

## 2020-02-29 DIAGNOSIS — Z6834 Body mass index (BMI) 34.0-34.9, adult: Secondary | ICD-10-CM | POA: Diagnosis not present

## 2020-02-29 DIAGNOSIS — Z0000 Encounter for general adult medical examination without abnormal findings: Secondary | ICD-10-CM | POA: Diagnosis not present

## 2020-02-29 DIAGNOSIS — E7800 Pure hypercholesterolemia, unspecified: Secondary | ICD-10-CM | POA: Diagnosis not present

## 2020-02-29 DIAGNOSIS — E1129 Type 2 diabetes mellitus with other diabetic kidney complication: Secondary | ICD-10-CM | POA: Diagnosis not present

## 2020-02-29 DIAGNOSIS — I10 Essential (primary) hypertension: Secondary | ICD-10-CM | POA: Diagnosis not present

## 2020-04-10 ENCOUNTER — Encounter (HOSPITAL_BASED_OUTPATIENT_CLINIC_OR_DEPARTMENT_OTHER): Payer: Self-pay | Admitting: *Deleted

## 2020-04-10 ENCOUNTER — Emergency Department (HOSPITAL_BASED_OUTPATIENT_CLINIC_OR_DEPARTMENT_OTHER)
Admission: EM | Admit: 2020-04-10 | Discharge: 2020-04-10 | Disposition: A | Discharge status: Home / Self Care | Payer: Medicare Other | Attending: Emergency Medicine | Admitting: Emergency Medicine

## 2020-04-10 ENCOUNTER — Other Ambulatory Visit: Payer: Self-pay

## 2020-04-10 DIAGNOSIS — E119 Type 2 diabetes mellitus without complications: Secondary | ICD-10-CM | POA: Diagnosis not present

## 2020-04-10 DIAGNOSIS — I10 Essential (primary) hypertension: Secondary | ICD-10-CM | POA: Insufficient documentation

## 2020-04-10 DIAGNOSIS — Z882 Allergy status to sulfonamides status: Secondary | ICD-10-CM | POA: Insufficient documentation

## 2020-04-10 DIAGNOSIS — Z87891 Personal history of nicotine dependence: Secondary | ICD-10-CM | POA: Insufficient documentation

## 2020-04-10 DIAGNOSIS — Z23 Encounter for immunization: Secondary | ICD-10-CM | POA: Diagnosis not present

## 2020-04-10 DIAGNOSIS — W57XXXA Bitten or stung by nonvenomous insect and other nonvenomous arthropods, initial encounter: Secondary | ICD-10-CM | POA: Diagnosis not present

## 2020-04-10 DIAGNOSIS — Z79899 Other long term (current) drug therapy: Secondary | ICD-10-CM | POA: Insufficient documentation

## 2020-04-10 DIAGNOSIS — S30860A Insect bite (nonvenomous) of lower back and pelvis, initial encounter: Secondary | ICD-10-CM | POA: Diagnosis not present

## 2020-04-10 DIAGNOSIS — Z7984 Long term (current) use of oral hypoglycemic drugs: Secondary | ICD-10-CM | POA: Diagnosis not present

## 2020-04-10 DIAGNOSIS — Y939 Activity, unspecified: Secondary | ICD-10-CM | POA: Diagnosis not present

## 2020-04-10 DIAGNOSIS — Y999 Unspecified external cause status: Secondary | ICD-10-CM | POA: Diagnosis not present

## 2020-04-10 DIAGNOSIS — Y92096 Garden or yard of other non-institutional residence as the place of occurrence of the external cause: Secondary | ICD-10-CM | POA: Diagnosis not present

## 2020-04-10 DIAGNOSIS — S3992XA Unspecified injury of lower back, initial encounter: Secondary | ICD-10-CM | POA: Diagnosis present

## 2020-04-10 MED ORDER — TETANUS-DIPHTH-ACELL PERTUSSIS 5-2.5-18.5 LF-MCG/0.5 IM SUSP
0.5000 mL | Freq: Once | INTRAMUSCULAR | Status: AC
  Administered 2020-04-10: 18:00:00 0.5 mL via INTRAMUSCULAR
  Filled 2020-04-10: qty 0.5

## 2020-04-10 NOTE — ED Notes (Signed)
Tick to left lower back.

## 2020-04-10 NOTE — ED Provider Notes (Signed)
[facility] EMERGENCY DEPARTMENT Provider Note   CSN: [id number] Arrival date & time: 04/10/20  1519     History Chief Complaint  Patient presents with   Tick Removal    Frank Taylor is a 79 y.o. male.  The history is provided by the patient and medical records. No language interpreter was used.  Animal Bite Contact animal:  Insect Location:  Torso Torso injury location:  Back Time since incident:  4 days Pain details:    Quality:  Unable to specify   Severity:  No pain Incident location:  Outside Provoked: unprovoked   Notifications:  None Tetanus status:  Unknown (will update) Relieved by:  Nothing Worsened by:  Nothing Ineffective treatments:  None tried Associated symptoms: no fever, no numbness, no rash and no swelling        Past Medical History:  Diagnosis Date   Diabetes mellitus without complication ([place])    Hypertension     There are no problems to display for this patient.   History reviewed. No pertinent surgical history.     No family history on file.  Social History   Tobacco Use   Smoking status: Former Smoker   Smokeless tobacco: Never Used  Substance Use Topics   Alcohol use: Yes   Drug use: No    Home Medications Prior to Admission medications   Medication Sig Start Date End Date Taking? Authorizing Provider  lisinopril (ZESTRIL) 10 MG tablet Take 10 mg by mouth daily.   Yes [provider]  metFORMIN (GLUCOPHAGE-XR) 750 MG 24 hr tablet Take 750 mg by mouth daily with breakfast.    [provider]  pravastatin (PRAVACHOL) 80 MG tablet Take 80 mg by mouth daily.    [provider]  valsartan (DIOVAN) 160 MG tablet Take 160 mg by mouth daily.    [provider]    Allergies    Sulfa antibiotics  Review of Systems   Review of Systems  Constitutional: Negative for chills, diaphoresis and fever.  HENT: Negative for congestion.   Eyes: Negative for visual disturbance.    Respiratory: Negative for cough, chest tightness, shortness of breath and wheezing.   Cardiovascular: Negative for chest pain.  Gastrointestinal: Negative for abdominal pain, constipation, diarrhea, nausea and vomiting.  Genitourinary: Negative for dysuria.  Musculoskeletal: Negative for back pain, neck pain and neck stiffness.  Skin: Positive for wound. Negative for color change and rash.  Neurological: Negative for light-headedness, numbness and headaches.  Psychiatric/Behavioral: Negative for agitation.  All other systems reviewed and are negative.   Physical Exam Updated Vital Signs BP (!) 151/100 (BP Location: Left Arm)    Pulse 72    Temp 98.3 F (36.8 C)    SpO2 96%   Physical Exam Vitals and nursing note reviewed.  Constitutional:      General: He is not in acute distress.    Appearance: He is well-developed. He is not ill-appearing, toxic-appearing or diaphoretic.  HENT:     Head: Normocephalic and atraumatic.     Nose: No congestion or rhinorrhea.     Mouth/Throat:     Mouth: Mucous membranes are moist.     Pharynx: No oropharyngeal exudate or posterior oropharyngeal erythema.  Eyes:     Extraocular Movements: Extraocular movements intact.     Conjunctiva/sclera: Conjunctivae normal.     Pupils: Pupils are equal, round, and reactive to light.  Cardiovascular:     Rate and Rhythm: Normal rate and regular rhythm.  Pulses: Normal pulses.     Heart sounds: No murmur heard.   Pulmonary:     Effort: Pulmonary effort is normal. No respiratory distress.     Breath sounds: Normal breath sounds. No wheezing, rhonchi or rales.  Chest:     Chest wall: No tenderness.  Abdominal:     General: Abdomen is flat.     Palpations: Abdomen is soft.     Tenderness: There is no abdominal tenderness. There is no right CVA tenderness, left CVA tenderness, guarding or rebound.  Musculoskeletal:        General: No tenderness.     Cervical back: Neck supple. No tenderness.      Thoracic back: No tenderness.     Lumbar back: No tenderness.       Back:     Right lower leg: No edema.     Left lower leg: No edema.  Skin:    General: Skin is warm and dry.     Capillary Refill: Capillary refill takes less than 2 seconds.     Findings: No erythema or rash.  Neurological:     General: No focal deficit present.     Mental Status: He is alert.  Psychiatric:        Mood and Affect: Mood normal.     ED Results / Procedures / Treatments   Labs (all labs ordered are listed, but only abnormal results are displayed) Labs Reviewed - No data to display  EKG None  Radiology No results found.  Procedures .Foreign Body Removal  Date/Time: 04/10/2020 6:01 PM Performed by: Courtney Paris, MD Authorized by: Courtney Paris, MD  Body area: skin General location: trunk Location details: back  Sedation: Patient sedated: no  Patient restrained: no Patient cooperative: yes Complexity: simple 1 objects recovered. Objects recovered: engorged tick with head in place Post-procedure assessment: foreign body removed Patient tolerance: patient tolerated the procedure well with no immediate complications   (including critical care time)  Medications Ordered in ED Medications - No data to display  ED Course  I have reviewed the triage vital signs and the nursing notes.  Pertinent labs & imaging results that were available during my care of the patient were reviewed by me and considered in my medical decision making (see chart for details).    MDM Rules/Calculators/A&P                          Frank Taylor is a 79 y.o. male with a past medical history significant for diabetes and hypertension who presents with tick bite.  Patient reports that he was doing yard work on Friday, 4 days ago, and this afternoon his wife found a tick on his back.  It appears to be engorged and he likely picked it up on Friday.  He reports he has not been outside since.   He denies any pain.  He denies any fevers, chills, rash, nausea, vomiting, neurologic complaints, or malaise.  He otherwise feels totally normal but his wife was unable to get it off.  He reports that she tried nail polish remover and rubbing alcohol.  Patient presents for tick removal and for evaluation.  On exam, patient has a 1 cm tick on his left low back that appears engorged.  There is no surrounding erythema.  No crepitance or tenderness otherwise.  Lungs clear chest nontender.  No murmur.  Abdomen nontender.  Patient resting comfortably.  Tick was removed gently  and carefully and all of the tick was removed including the head and attachment.  Wound was cleaned with alcohol wipe.  Minimal bleeding after.  No tenderness.  Patient otherwise well-appearing.  No rash seen on rest of exam.  Patient does not know his tetanus status.  Will give Tdap today.  Had a shared decision-making conversation about prophylactic antibiotics with doxycycline given the prevalence of [facility] spotted fever in [place] as well as the length of exposure of the tick.  He reports that since he is feeling well, he does not want antibiotics at this time.  He knows to watch for signs of RMSF including rash, fever, chills, and malaise.  He will return if any symptoms began.  He understands return precautions and follow-up instructions and was discharged in good condition after tick removal.  Final Clinical Impression(s) / ED Diagnoses Final diagnoses:  Tick bite of back, initial encounter    Rx / DC Orders ED Discharge Orders    None      Clinical Impression: 1. Tick bite of back, initial encounter     Disposition: Discharge  Condition: Good  I have discussed the results, Dx and Tx plan with the pt(& family if present). He/she/they expressed understanding and agree(s) with the plan. Discharge instructions discussed at great length. Strict return precautions discussed and pt &/or family have  verbalized understanding of the instructions. No further questions at time of discharge.    New Prescriptions   No medications on file    Follow Up: Angelina Sheriff, MD [place] [id number] [phone]     [facility] [address] [id number] [facility] [place] [phone]       Betul Brisky, Gwenyth Allegra, MD 04/10/20 [phone]

## 2020-04-10 NOTE — Discharge Instructions (Signed)
Your history and exam are consistent with a tick bite that have been on there likely since Friday.  We had a long shared decision-making conversation about prophylactic doxycycline to prevent [facility] spotted fever however, given her otherwise well appearance, and lack of rash or fever, we agreed to hold on antibiotics at this time.  If you develop any changes including rash, malaise, fatigue, or fevers, please return to the nearest emergency department immediately.

## 2020-04-10 NOTE — ED Triage Notes (Signed)
Dog tick to left middle back

## 2020-04-10 NOTE — ED Notes (Signed)
ED Provider at bedside. 

## 2020-06-02 DIAGNOSIS — Z23 Encounter for immunization: Secondary | ICD-10-CM | POA: Diagnosis not present

## 2020-09-05 DIAGNOSIS — E1165 Type 2 diabetes mellitus with hyperglycemia: Secondary | ICD-10-CM | POA: Diagnosis not present

## 2020-09-05 DIAGNOSIS — I10 Essential (primary) hypertension: Secondary | ICD-10-CM | POA: Diagnosis not present

## 2020-09-05 DIAGNOSIS — Z79899 Other long term (current) drug therapy: Secondary | ICD-10-CM | POA: Diagnosis not present

## 2020-09-05 DIAGNOSIS — R5383 Other fatigue: Secondary | ICD-10-CM | POA: Diagnosis not present

## 2020-09-05 DIAGNOSIS — Z6834 Body mass index (BMI) 34.0-34.9, adult: Secondary | ICD-10-CM | POA: Diagnosis not present

## 2020-09-05 DIAGNOSIS — M1990 Unspecified osteoarthritis, unspecified site: Secondary | ICD-10-CM | POA: Diagnosis not present

## 2020-11-28 DIAGNOSIS — D1801 Hemangioma of skin and subcutaneous tissue: Secondary | ICD-10-CM | POA: Diagnosis not present

## 2020-11-28 DIAGNOSIS — C44311 Basal cell carcinoma of skin of nose: Secondary | ICD-10-CM | POA: Diagnosis not present

## 2020-11-28 DIAGNOSIS — D2261 Melanocytic nevi of right upper limb, including shoulder: Secondary | ICD-10-CM | POA: Diagnosis not present

## 2020-11-28 DIAGNOSIS — L723 Sebaceous cyst: Secondary | ICD-10-CM | POA: Diagnosis not present

## 2020-11-28 DIAGNOSIS — L821 Other seborrheic keratosis: Secondary | ICD-10-CM | POA: Diagnosis not present

## 2020-11-28 DIAGNOSIS — Z85820 Personal history of malignant melanoma of skin: Secondary | ICD-10-CM | POA: Diagnosis not present

## 2020-11-28 DIAGNOSIS — D485 Neoplasm of uncertain behavior of skin: Secondary | ICD-10-CM | POA: Diagnosis not present

## 2020-11-28 DIAGNOSIS — D225 Melanocytic nevi of trunk: Secondary | ICD-10-CM | POA: Diagnosis not present

## 2021-03-08 DIAGNOSIS — L259 Unspecified contact dermatitis, unspecified cause: Secondary | ICD-10-CM | POA: Diagnosis not present

## 2021-03-08 DIAGNOSIS — Z6834 Body mass index (BMI) 34.0-34.9, adult: Secondary | ICD-10-CM | POA: Diagnosis not present

## 2021-07-02 DIAGNOSIS — Z23 Encounter for immunization: Secondary | ICD-10-CM | POA: Diagnosis not present

## 2021-10-02 DIAGNOSIS — E1129 Type 2 diabetes mellitus with other diabetic kidney complication: Secondary | ICD-10-CM | POA: Diagnosis not present

## 2021-10-02 DIAGNOSIS — E7800 Pure hypercholesterolemia, unspecified: Secondary | ICD-10-CM | POA: Diagnosis not present

## 2021-10-02 DIAGNOSIS — Z0000 Encounter for general adult medical examination without abnormal findings: Secondary | ICD-10-CM | POA: Diagnosis not present

## 2021-10-02 DIAGNOSIS — I10 Essential (primary) hypertension: Secondary | ICD-10-CM | POA: Diagnosis not present

## 2021-10-02 DIAGNOSIS — K219 Gastro-esophageal reflux disease without esophagitis: Secondary | ICD-10-CM | POA: Diagnosis not present

## 2021-10-02 DIAGNOSIS — Z6834 Body mass index (BMI) 34.0-34.9, adult: Secondary | ICD-10-CM | POA: Diagnosis not present

## 2021-10-02 DIAGNOSIS — Z79899 Other long term (current) drug therapy: Secondary | ICD-10-CM | POA: Diagnosis not present

## 2021-11-28 DIAGNOSIS — Z85828 Personal history of other malignant neoplasm of skin: Secondary | ICD-10-CM | POA: Diagnosis not present

## 2021-11-28 DIAGNOSIS — D225 Melanocytic nevi of trunk: Secondary | ICD-10-CM | POA: Diagnosis not present

## 2021-11-28 DIAGNOSIS — Z85820 Personal history of malignant melanoma of skin: Secondary | ICD-10-CM | POA: Diagnosis not present

## 2021-11-28 DIAGNOSIS — L723 Sebaceous cyst: Secondary | ICD-10-CM | POA: Diagnosis not present

## 2021-11-28 DIAGNOSIS — L821 Other seborrheic keratosis: Secondary | ICD-10-CM | POA: Diagnosis not present

## 2021-11-28 DIAGNOSIS — D1801 Hemangioma of skin and subcutaneous tissue: Secondary | ICD-10-CM | POA: Diagnosis not present

## 2022-10-23 DIAGNOSIS — E119 Type 2 diabetes mellitus without complications: Secondary | ICD-10-CM | POA: Diagnosis not present

## 2022-11-04 DIAGNOSIS — E1129 Type 2 diabetes mellitus with other diabetic kidney complication: Secondary | ICD-10-CM | POA: Diagnosis not present

## 2022-11-04 DIAGNOSIS — Z0000 Encounter for general adult medical examination without abnormal findings: Secondary | ICD-10-CM | POA: Diagnosis not present

## 2022-11-04 DIAGNOSIS — Z6833 Body mass index (BMI) 33.0-33.9, adult: Secondary | ICD-10-CM | POA: Diagnosis not present

## 2022-11-04 DIAGNOSIS — E7800 Pure hypercholesterolemia, unspecified: Secondary | ICD-10-CM | POA: Diagnosis not present

## 2022-11-04 DIAGNOSIS — I10 Essential (primary) hypertension: Secondary | ICD-10-CM | POA: Diagnosis not present

## 2022-11-05 DIAGNOSIS — E1129 Type 2 diabetes mellitus with other diabetic kidney complication: Secondary | ICD-10-CM | POA: Diagnosis not present

## 2022-12-02 DIAGNOSIS — D2262 Melanocytic nevi of left upper limb, including shoulder: Secondary | ICD-10-CM | POA: Diagnosis not present

## 2022-12-02 DIAGNOSIS — D2271 Melanocytic nevi of right lower limb, including hip: Secondary | ICD-10-CM | POA: Diagnosis not present

## 2022-12-02 DIAGNOSIS — D1801 Hemangioma of skin and subcutaneous tissue: Secondary | ICD-10-CM | POA: Diagnosis not present

## 2022-12-02 DIAGNOSIS — D2272 Melanocytic nevi of left lower limb, including hip: Secondary | ICD-10-CM | POA: Diagnosis not present

## 2022-12-02 DIAGNOSIS — D2261 Melanocytic nevi of right upper limb, including shoulder: Secondary | ICD-10-CM | POA: Diagnosis not present

## 2022-12-02 DIAGNOSIS — Z85820 Personal history of malignant melanoma of skin: Secondary | ICD-10-CM | POA: Diagnosis not present

## 2022-12-02 DIAGNOSIS — L570 Actinic keratosis: Secondary | ICD-10-CM | POA: Diagnosis not present

## 2022-12-02 DIAGNOSIS — L821 Other seborrheic keratosis: Secondary | ICD-10-CM | POA: Diagnosis not present

## 2022-12-02 DIAGNOSIS — L723 Sebaceous cyst: Secondary | ICD-10-CM | POA: Diagnosis not present

## 2022-12-02 DIAGNOSIS — D225 Melanocytic nevi of trunk: Secondary | ICD-10-CM | POA: Diagnosis not present

## 2023-07-15 DIAGNOSIS — E7800 Pure hypercholesterolemia, unspecified: Secondary | ICD-10-CM | POA: Diagnosis not present

## 2023-07-15 DIAGNOSIS — I10 Essential (primary) hypertension: Secondary | ICD-10-CM | POA: Diagnosis not present

## 2023-07-15 DIAGNOSIS — K219 Gastro-esophageal reflux disease without esophagitis: Secondary | ICD-10-CM | POA: Diagnosis not present

## 2023-07-15 DIAGNOSIS — Z1211 Encounter for screening for malignant neoplasm of colon: Secondary | ICD-10-CM | POA: Diagnosis not present

## 2023-07-15 DIAGNOSIS — E119 Type 2 diabetes mellitus without complications: Secondary | ICD-10-CM | POA: Diagnosis not present

## 2023-07-17 DIAGNOSIS — E119 Type 2 diabetes mellitus without complications: Secondary | ICD-10-CM | POA: Diagnosis not present
